# Patient Record
Sex: Male | Born: 1990 | Race: Black or African American | Hispanic: No | Marital: Single | Smoking: Former smoker
Health system: Southern US, Community
[De-identification: ages and names within clinical notes are randomized; demographics above are authoritative.]

## PROBLEM LIST (undated history)

## (undated) DIAGNOSIS — Z789 Other specified health status: Secondary | ICD-10-CM

## (undated) HISTORY — DX: Other specified health status: Z78.9

---
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
## 2014-04-10 ENCOUNTER — Telehealth: Payer: Self-pay

## 2014-04-10 NOTE — Telephone Encounter (Signed)
Error

## 2015-01-24 ENCOUNTER — Encounter (HOSPITAL_COMMUNITY): Payer: Self-pay

## 2015-01-24 ENCOUNTER — Emergency Department (HOSPITAL_COMMUNITY): Payer: Self-pay

## 2015-01-24 ENCOUNTER — Emergency Department (HOSPITAL_COMMUNITY)
Admission: EM | Admit: 2015-01-24 | Discharge: 2015-01-24 | Disposition: A | Discharge status: Home / Self Care | Payer: Self-pay | Attending: Emergency Medicine | Admitting: Emergency Medicine

## 2015-01-24 DIAGNOSIS — Y998 Other external cause status: Secondary | ICD-10-CM | POA: Insufficient documentation

## 2015-01-24 DIAGNOSIS — Z720 Tobacco use: Secondary | ICD-10-CM | POA: Insufficient documentation

## 2015-01-24 DIAGNOSIS — W14XXXA Fall from tree, initial encounter: Secondary | ICD-10-CM | POA: Insufficient documentation

## 2015-01-24 DIAGNOSIS — Y9389 Activity, other specified: Secondary | ICD-10-CM | POA: Insufficient documentation

## 2015-01-24 DIAGNOSIS — S42432A Displaced fracture (avulsion) of lateral epicondyle of left humerus, initial encounter for closed fracture: Secondary | ICD-10-CM | POA: Insufficient documentation

## 2015-01-24 DIAGNOSIS — Z791 Long term (current) use of non-steroidal anti-inflammatories (NSAID): Secondary | ICD-10-CM | POA: Insufficient documentation

## 2015-01-24 DIAGNOSIS — Y9289 Other specified places as the place of occurrence of the external cause: Secondary | ICD-10-CM | POA: Insufficient documentation

## 2015-01-24 DIAGNOSIS — S42433A Displaced fracture (avulsion) of lateral epicondyle of unspecified humerus, initial encounter for closed fracture: Secondary | ICD-10-CM

## 2015-01-24 MED ORDER — HYDROCODONE-ACETAMINOPHEN 5-325 MG PO TABS: 1.0000 | ORAL_TABLET | ORAL | Status: DC | PRN

## 2015-01-24 MED ORDER — ACETAMINOPHEN 325 MG PO TABS
650.0000 mg | ORAL_TABLET | Freq: Once | ORAL | Status: AC
  Administered 2015-01-24: 650 mg via ORAL
  Filled 2015-01-24: qty 2

## 2015-01-24 MED ORDER — NAPROXEN 500 MG PO TABS: 500.0000 mg | ORAL_TABLET | Freq: Two times a day (BID) | ORAL | Status: DC

## 2015-01-24 NOTE — Discharge Instructions (Signed)
Avulsion fracture of your lateral epicondyle.  A fracture is a break in one of the bones.When fractures are not displaced or separated, they may be treated with only a sling or splint. The sling or splint may only be required for two to three weeks. In these cases, often the elbow is put through early range of motion exercises to prevent the elbow from getting stiff. DIAGNOSIS  The diagnosis (learning what is wrong) of a fractured elbow is made by x-ray. These may be required before and after the elbow is put into a splint or cast. X-rays are taken after to make sure the bone pieces have not moved. HOME CARE INSTRUCTIONS   Only take over-the-counter or prescription medicines for pain, discomfort, or fever as directed by your caregiver.  If you have a splint held on with an elastic wrap and your hand or fingers become numb or cold and blue, loosen the wrap and reapply more loosely. See your caregiver if there is no relief.  You may use ice for twenty minutes, four times per day, for the first two to three days.  Use your elbow as directed.  See your caregiver as directed. It is very important to keep all follow-up referrals and appointments in order to avoid any long-term problems with your elbow including chronic pain or stiffness. SEEK IMMEDIATE MEDICAL CARE IF:   There is swelling or increasing pain in elbow.  You begin to lose feeling or experience numbness or tingling in your hand or fingers.  You develop swelling of the hand and fingers.  You get a cold or blue hand or fingers on affected side. MAKE SURE YOU:   Understand these instructions.  Will watch your condition.  Will get help right away if you are not doing well or get worse. Document Released: 10/26/2001 Document Revised: 01/24/2012 Document Reviewed: 09/16/2009 [facility] Patient Information 2015 [place], [place]. This information is not intended to replace advice given to you by your health care provider. Make sure you  discuss any questions you have with your health care provider. Arm Sling Use A sling is used to:  Limit how much your arm moves.  Make you more comfortable.  Support your arm. The sling fits well if:  Your elbow rests in the bottom and corner pocket.  Only your fingers show at the opening. Your wrist should fit inside and be supported by the sling.  The strap goes around your shoulder or neck for support.  Your arm is fairly level with your hand, slightly higher than your elbow. HOME CARE   Adjust the sling to keep the hand inside. Slings tend to slip, making the elbow point up. Tug the elbow back into place.  The fingers should feel warm and be a normal color.  Try to keep the palm of the hand toward the body while wearing the sling.  Take the sling off when going to sleep if this is okay with your doctor.  Use an extra pillow at night to protect the arm. Slide the arm between a pillow and the cover.  Take baths or showers as told by your doctor.  Only take medicine as told by your doctor. GET HELP RIGHT AWAY IF:   The fingers turn cold or start to tingle.  The arm pain gets worse.  The pain is not helped by medicine or by adjusting the sling. MAKE SURE YOU:   Understand these instructions.  Will watch this condition.  Will get help right away if  you are not doing well or get worse. Document Released: 04/19/2008 Document Revised: 01/24/2012 Document Reviewed: 04/19/2008 [facility] Patient Information 2015 [place], [place]. This information is not intended to replace advice given to you by your health care provider. Make sure you discuss any questions you have with your health care provider.

## 2015-01-24 NOTE — ED Notes (Signed)
Pt reports falling from a tree last night and has had left elbow pain since.  Swelling to extremity in triage.  Sensation intact distally to injury.

## 2015-01-24 NOTE — ED Provider Notes (Signed)
CSN: [id number]     Arrival date & time 01/24/15  1630 History  This chart was scribed for non-physician practitioner, Will Marijo File, PA-C working with Nelva Nay, MD by Angelene Giovanni, ED Scribe. The patient was seen in room TR10C/TR10C and the patient's care was started at 7:43 PM     Chief Complaint  Patient presents with  . Elbow Injury   The history is provided by the patient. No language interpreter was used.   HPI Comments: Chananya Bynum is a 24 y.o. male who presents to the Emergency Department status post fall that occurred last night when he fell from a 6 ft tree, landing on his elbow. He denies LOC, hitting his head or head injury. He reports a constant 6-8/10 left elbow pain worse with movement. He denies any other pain or injuries due to that fall. He denies any numbness, tingling, or weakness. He also denies taking any medication since onset.    History reviewed. No pertinent past medical history. History reviewed. No pertinent past surgical history. History reviewed. No pertinent family history. History  Substance Use Topics  . Smoking status: Current Some Day Smoker    Types: Cigarettes  . Smokeless tobacco: Not on file  . Alcohol Use: No    Review of Systems  Cardiovascular: Negative for chest pain.  Gastrointestinal: Negative for abdominal pain.  Musculoskeletal: Positive for arthralgias (Left elbow). Negative for back pain.  Neurological: Negative for weakness and numbness.      Allergies  Review of patient's allergies indicates not on file.  Home Medications   Prior to Admission medications   Medication Sig Start Date End Date Taking? Authorizing Provider  HYDROcodone-acetaminophen (NORCO/VICODIN) 5-325 MG per tablet Take 1 tablet by mouth every 4 (four) hours as needed. 01/24/15   Everlene Farrier, PA-C  naproxen (NAPROSYN) 500 MG tablet Take 1 tablet (500 mg total) by mouth 2 (two) times daily with a meal. 01/24/15   Everlene Farrier, PA-C   BP 140/80  mmHg  Pulse 90  Temp(Src) 98 F (36.7 C) (Oral)  Resp 14  Ht  (1.803 m)  Wt 178 lb (80.74 kg)  BMI 24.84 kg/m2  SpO2 94% Physical Exam  Constitutional: He is oriented to person, place, and time. He appears well-developed and well-nourished. No distress.  HENT:  Head: Normocephalic and atraumatic.  Eyes: Conjunctivae are normal. Pupils are equal, round, and reactive to light. Right eye exhibits no discharge. Left eye exhibits no discharge.  Neck: Neck supple. No tracheal deviation present.  Cardiovascular: Normal rate, regular rhythm, normal heart sounds and intact distal pulses.  Exam reveals no gallop and no friction rub.   No murmur heard. Bilateral radial pulses intact.    Pulmonary/Chest: Effort normal and breath sounds normal. No respiratory distress. He has no wheezes. He has no rales.  Musculoskeletal: He exhibits edema.  Mild swelling to left elbow and forearm Restrict ROM due to pain Sensation intact in bilateral upper extremities.   Neurological: He is alert and oriented to person, place, and time. Coordination normal.  Radial, median and ulna nerves intact on left hand.   Skin: Skin is warm and dry. No rash noted. No erythema. No pallor.  Psychiatric: He has a normal mood and affect. His behavior is normal.  Nursing note and vitals reviewed.   ED Course  Procedures (including critical care time) DIAGNOSTIC STUDIES: Oxygen Saturation is 94% on RA, adequate by my interpretation.    COORDINATION OF CARE: 7:47 PM- Pt advised of plan  for treatment and pt agrees.    Labs Review Labs Reviewed - No data to display  Imaging Review Dg Elbow Complete Left  01/24/2015   CLINICAL DATA:  Left elbow pain and swelling after falling from a tree yesterday. Initial encounter.  EXAM: LEFT ELBOW - COMPLETE 3+ VIEW  COMPARISON:  None.  FINDINGS: There is a small elbow joint effusion. There is a small avulsion fracture laterally, likely arising from the lateral humeral  epicondyle. The radial head appears intact and is normally positioned. The proximal ulna appears intact.  IMPRESSION: Small avulsion fracture laterally, likely from the lateral humeral epicondyle. Associated small elbow joint effusion. No dislocation.   Electronically Signed   By: Carey BullocksWilliam  Veazey M.D.   On: 01/24/2015 17:18     EKG Interpretation None      Filed Vitals:   01/24/15 1641  BP: 140/80  Pulse: 90  Temp: 98 F (36.7 C)  TempSrc: Oral  Resp: 14  Height: 5\' 11"  (1.803 m)  Weight: 178 lb (80.74 kg)  SpO2: 94%     MDM   Meds given in ED:  Medications  acetaminophen (TYLENOL) tablet 650 mg (650 mg Oral Given 01/24/15 1952)    New Prescriptions   HYDROCODONE-ACETAMINOPHEN (NORCO/VICODIN) 5-325 MG PER TABLET    Take 1 tablet by mouth every 4 (four) hours as needed.   NAPROXEN (NAPROSYN) 500 MG TABLET    Take 1 tablet (500 mg total) by mouth 2 (two) times daily with a meal.    Final diagnoses:  Avulsion fracture of lateral epicondyle of humerus   Visit 24 year old male who presents the emergency department complaining of left elbow pain after falling out of a tree yesterday. He denies head injury, neck, back pain, or loss of consciousness. Patient denies any numbness, tingling or weakness. The patient's radial, medial and ulnar nerves are intact in his left arm. The patient's x-ray indicates a small avulsion fracture laterally likely from the lateral humeral epicondyle. We'll discharge with arm sling and pain medicine. Advised patient to follow-up with orthopedic surgery. I advised the patient to follow-up with their primary care provider this week. I advised the patient to return to the emergency department with new or worsening symptoms or new concerns. The patient verbalized understanding and agreement with plan.   I personally performed the services described in this documentation, which was scribed in my presence. The recorded information has been reviewed and is  accurate.    Everlene FarrierWilliam Trinidee Schrag, PA-C 01/24/15 2000  Nelva Nayobert Beaton, MD 01/25/15 1056

## 2016-05-05 IMAGING — CR DG ELBOW COMPLETE 3+V*L*
4 series · 4 of 4 positions shown · non-contrast
Comparison: None.

CLINICAL DATA: Left elbow pain and swelling after falling from a
tree yesterday. Initial encounter.

EXAM:
LEFT ELBOW - COMPLETE 3+ VIEW

[elbow ap]
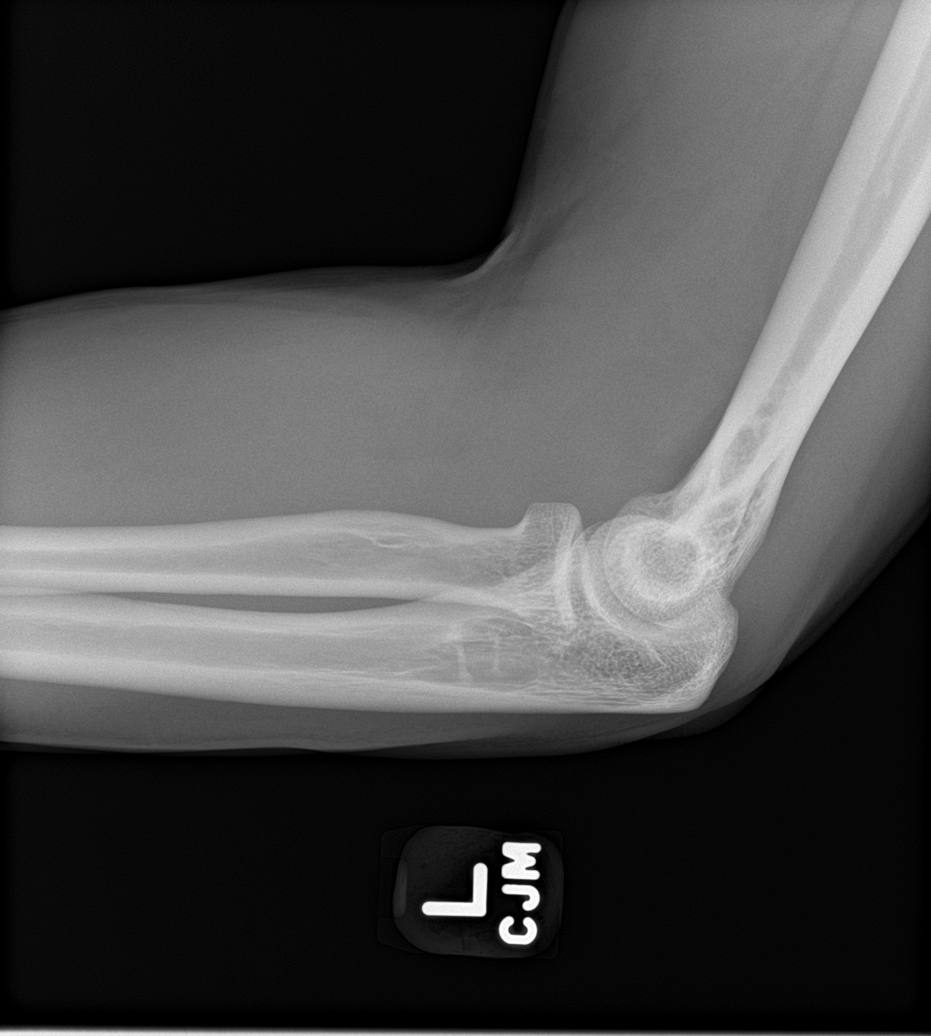

[elbow obl (1 of 2)]
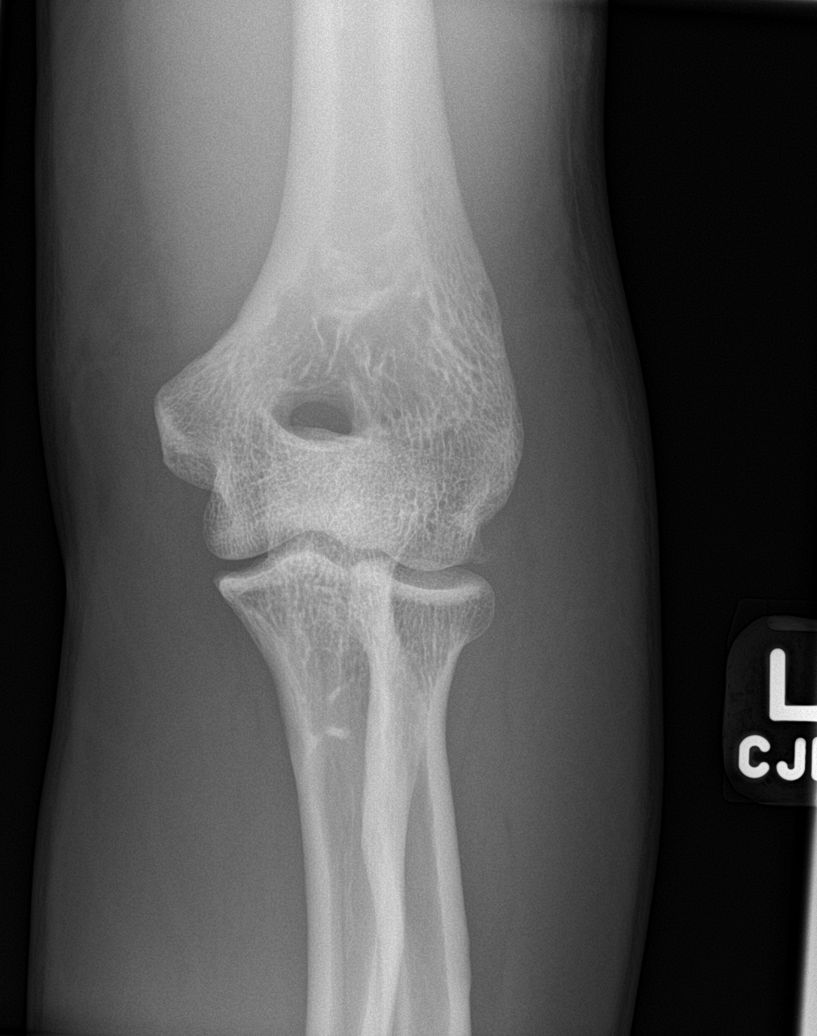

[elbow obl (2 of 2)]
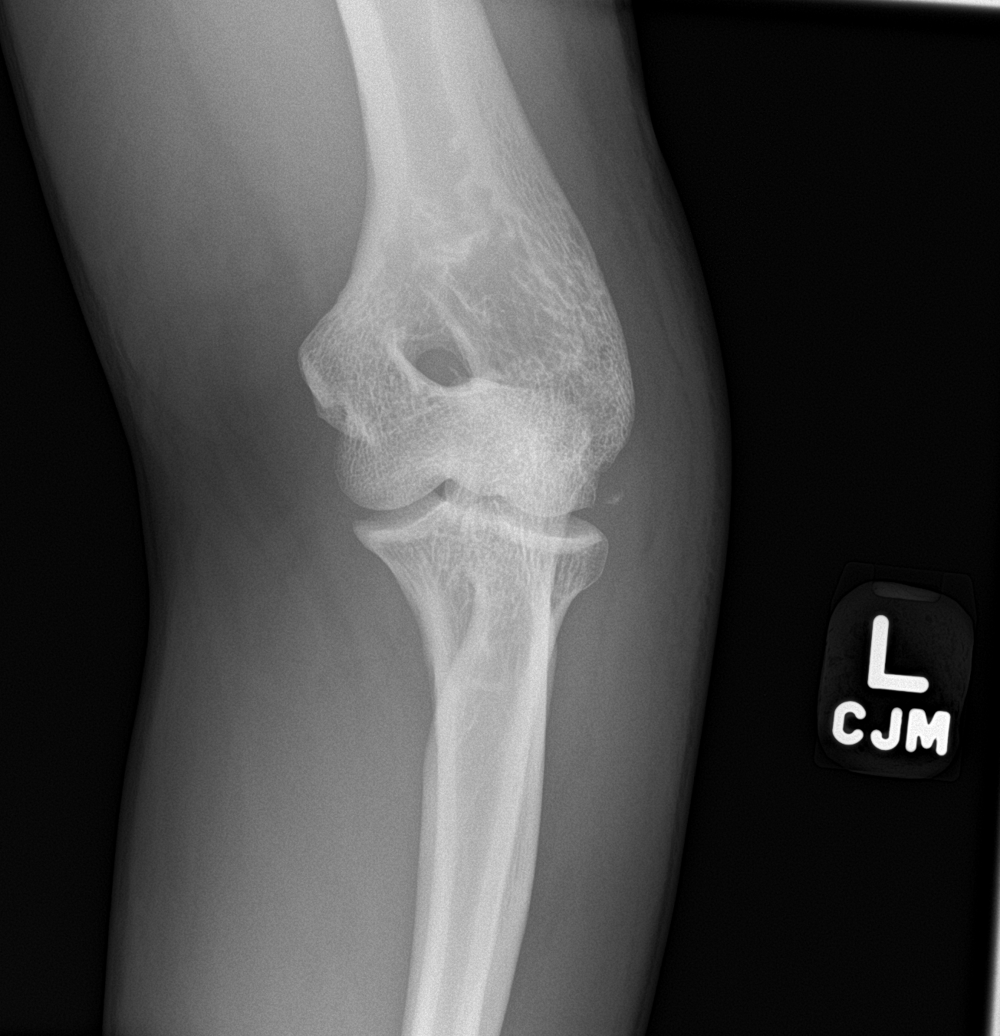

[elbow lat]
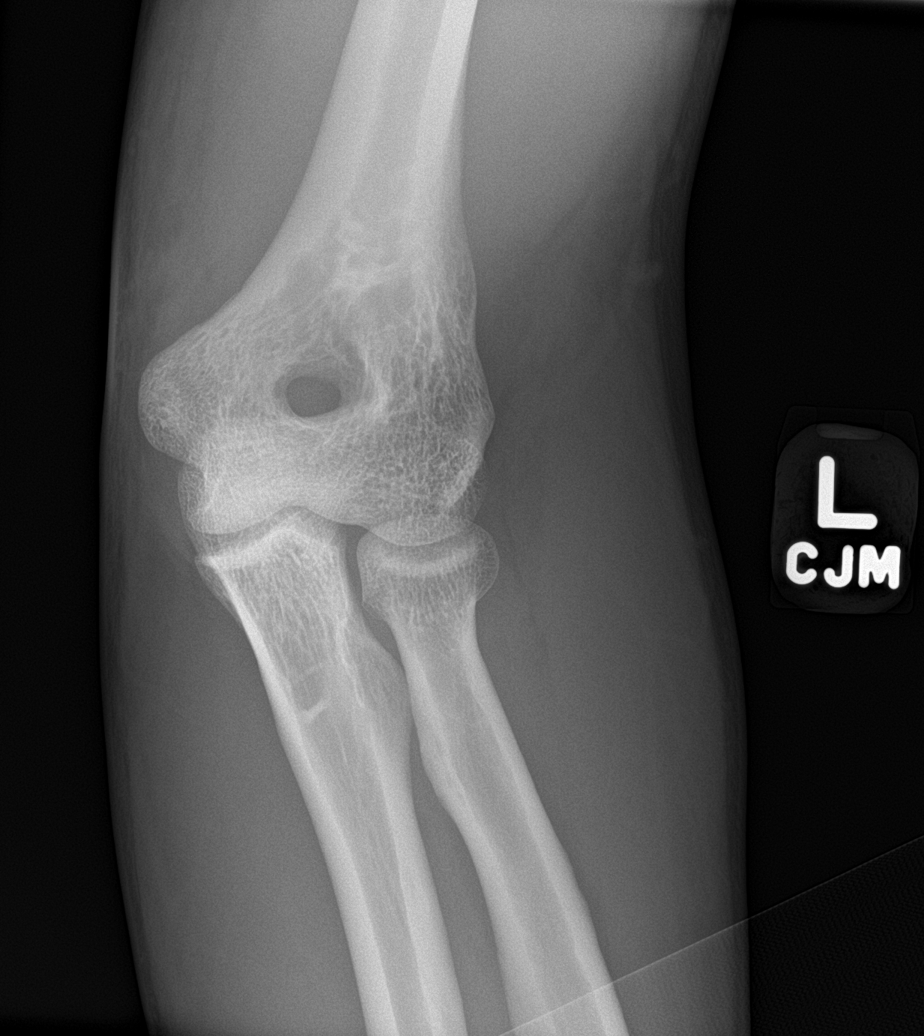

[4 of 4 positions shown; findings below may reference images not displayed]

FINDINGS: There is a small elbow joint effusion. There is a small avulsion
fracture laterally, likely arising from the lateral humeral
epicondyle. The radial head appears intact and is normally
positioned. The proximal ulna appears intact.
IMPRESSION: Small avulsion fracture laterally, likely from the lateral humeral
epicondyle. Associated small elbow joint effusion. No dislocation.

## 2018-12-19 ENCOUNTER — Ambulatory Visit (INDEPENDENT_AMBULATORY_CARE_PROVIDER_SITE_OTHER): Payer: BLUE CROSS/BLUE SHIELD | Admitting: Internal Medicine

## 2018-12-19 ENCOUNTER — Encounter: Payer: Self-pay | Admitting: Internal Medicine

## 2018-12-19 VITALS — BP 120/88 | HR 60 | Temp 98.4°F | Ht 72.0 in | Wt 196.6 lb

## 2018-12-19 DIAGNOSIS — Z716 Tobacco abuse counseling: Secondary | ICD-10-CM | POA: Diagnosis not present

## 2018-12-19 DIAGNOSIS — Z0000 Encounter for general adult medical examination without abnormal findings: Secondary | ICD-10-CM | POA: Diagnosis not present

## 2018-12-19 DIAGNOSIS — Z720 Tobacco use: Secondary | ICD-10-CM | POA: Diagnosis not present

## 2018-12-19 DIAGNOSIS — Z789 Other specified health status: Secondary | ICD-10-CM

## 2018-12-19 HISTORY — DX: Other specified health status: Z78.9

## 2018-12-19 NOTE — Patient Instructions (Addendum)
Great meeting you today.    Instructions: -schedule an annual physical next year, come fasting, so we can obtain blood work if needed   Preventive Care 18-39 Years, Male Preventive care refers to lifestyle choices and visits with your health care provider that can promote health and wellness. What does preventive care include?   A yearly physical exam. This is also called an annual well check.  Dental exams once or twice a year.  Routine eye exams. Ask your health care provider how often you should have your eyes checked.  Personal lifestyle choices, including: ? Daily care of your teeth and gums. ? Regular physical activity. ? Eating a healthy diet. ? Avoiding tobacco and drug use. ? Limiting alcohol use. ? Practicing safe sex. What happens during an annual well check? The services and screenings done by your health care provider during your annual well check will depend on your age, overall health, lifestyle risk factors, and family history of disease. Counseling Your health care provider may ask you questions about your:  Alcohol use.  Tobacco use.  Drug use.  Emotional well-being.  Home and relationship well-being.  Sexual activity.  Eating habits.  Work and work Statistician. Screening You may have the following tests or measurements:  Height, weight, and BMI.  Blood pressure.  Lipid and cholesterol levels. These may be checked every 5 years starting at age 48.  Diabetes screening. This is done by checking your blood sugar (glucose) after you have not eaten for a while (fasting).  Skin check.  Hepatitis C blood test.  Hepatitis B blood test.  Sexually transmitted disease (STD) testing. Discuss your test results, treatment options, and if necessary, the need for more tests with your health care provider. Vaccines Your health care provider may recommend certain vaccines, such as:  Influenza vaccine. This is recommended every year.  Tetanus,  diphtheria, and acellular pertussis (Tdap, Td) vaccine. You may need a Td booster every 10 years.  Varicella vaccine. You may need this if you have not been vaccinated.  HPV vaccine. If you are 30 or younger, you may need three doses over 6 months.  Measles, mumps, and rubella (MMR) vaccine. You may need at least one dose of MMR.You may also need a second dose.  Pneumococcal 13-valent conjugate (PCV13) vaccine. You may need this if you have certain conditions and have not been vaccinated.  Pneumococcal polysaccharide (PPSV23) vaccine. You may need one or two doses if you smoke cigarettes or if you have certain conditions.  Meningococcal vaccine. One dose is recommended if you are age 30-30 years and a first-year college student living in a residence hall, or if you have one of several medical conditions. You may also need additional booster doses.  Hepatitis A vaccine. You may need this if you have certain conditions or if you travel or work in places where you may be exposed to hepatitis A.  Hepatitis B vaccine. You may need this if you have certain conditions or if you travel or work in places where you may be exposed to hepatitis B.  Haemophilus influenzae type b (Hib) vaccine. You may need this if you have certain risk factors. Talk to your health care provider about which screenings and vaccines you need and how often you need them. This information is not intended to replace advice given to you by your health care provider. Make sure you discuss any questions you have with your health care provider. Document Released: 12/28/2001 Document Revised: 06/14/2017 Document Reviewed:  09/02/2015 Elsevier Interactive Patient Education  [employer].

## 2018-12-19 NOTE — Progress Notes (Signed)
New Patient Office Visit     CC/Reason for Visit: est. Care, PE Previous PCP: unknown, no care x5 years Last Visit: first visit  HPI: Carl Medina is a 28 y.o. male who is coming in today for the above mentioned reasons. Past Medical History is significant for prior smoking x3 years and now has been vaping nicotene x1 year.  He drinks about 6 beers on the weekends.  The only family history he knows of it PGM has hypertension.  He works in Engineer, technical sales and is in a relationship with a male.  They are sexually active and use condoms for protection.  He takes no medications and has no complaints today and plans to return next year for his annual physical.   Past Medical/Surgical History: Past Medical History:  Diagnosis Date  . Known health problems: none 12/19/2018    No past surgical history on file.  Social History:  reports that he quit smoking about 2 years ago. His smoking use included cigarettes. He quit after 3.00 years of use. He has never used smokeless tobacco. He reports current alcohol use of about 6.0 standard drinks of alcohol per week. He reports that he does not use drugs.  Allergies: No Known Allergies  Family History:  Family History  Problem Relation Age of Onset  . Hypertension Paternal Grandmother     No current outpatient medications on file.  Review of Systems:  Constitutional: Denies fever, chills, diaphoresis, appetite change and fatigue.  HEENT: Denies photophobia, eye pain, redness, hearing loss, ear pain, congestion, sore throat, rhinorrhea, sneezing, mouth sores, trouble swallowing, neck pain, neck stiffness and tinnitus.   Respiratory: Denies SOB, DOE, cough, chest tightness,  and wheezing.   Cardiovascular: Denies chest pain, palpitations and leg swelling.  Gastrointestinal: Denies nausea, vomiting, abdominal pain, diarrhea, constipation, blood in stool and abdominal distention.  Genitourinary: Denies dysuria, urgency, frequency, hematuria, flank  pain and difficulty urinating.  Endocrine: Denies: hot or cold intolerance, sweats, changes in hair or nails, polyuria, polydipsia. Musculoskeletal: Denies myalgias, back pain, joint swelling, arthralgias and gait problem.  Skin: Denies pallor, rash and wound.  Neurological: Denies dizziness, seizures, syncope, weakness, light-headedness, numbness and headaches.  Hematological: Denies adenopathy. Easy bruising, personal or family bleeding history  Psychiatric/Behavioral: Denies suicidal ideation, mood changes, confusion, nervousness, sleep disturbance and agitation   Physical Exam: Vitals:   12/19/18 0804  BP: 120/88  Pulse: 60  Temp: 98.4 F (36.9 C)  TempSrc: Oral  SpO2: 97%  Weight: 196 lb 9.6 oz (89.2 kg)  Height: 6' (1.829 m)   Body mass index is 26.66 kg/m.   Constitutional: NAD, calm, comfortable Eyes: PERRL, lids and conjunctivae normal Respiratory: clear to auscultation bilaterally, no wheezing, no crackles. Normal respiratory effort. No accessory muscle use.  Cardiovascular: Regular rate and rhythm, no murmurs / rubs / gallops. No extremity edema. 2+ pedal pulses. No carotid bruits.  Abdomen: no tenderness, no masses palpated. No hepatosplenomegaly. Bowel sounds positive.  Musculoskeletal: no clubbing / cyanosis. No joint deformity upper and lower extremities. Good ROM, no contractures. Normal muscle tone.  Skin: no rashes, lesions, ulcers. No induration Neurologic: CN 2-12 grossly intact. Sensation intact, DTR normal. Strength 5/5 in all 4.  Psychiatric: Normal judgment and insight. Alert and oriented x 3. Normal mood.    Impression and Plan:  Encounter for preventive health examination -Refused flu vaccine, UTD on tetanus. -Recommended routine eye and dental care. -Screening lab work starting at age 46 given no family history.  Tobacco abuse -  I have discussed tobacco cessation with the patient.  I have counseled the patient regarding the negative impacts of  continued tobacco use including but not limited to lung cancer, COPD, and cardiovascular disease.  I have discussed alternatives to tobacco and modalities that may help facilitate tobacco cessation including but not limited to biofeedback, hypnosis, and medications.  Total time spent with tobacco counseling was 4 minutes.       Patient Instructions  Great meeting you today.    Instructions: -schedule an annual physical next year, come fasting, so we can obtain blood work if needed   Preventive Care 18-39 Years, Male Preventive care refers to lifestyle choices and visits with your health care provider that can promote health and wellness. What does preventive care include?   A yearly physical exam. This is also called an annual well check.  Dental exams once or twice a year.  Routine eye exams. Ask your health care provider how often you should have your eyes checked.  Personal lifestyle choices, including: ? Daily care of your teeth and gums. ? Regular physical activity. ? Eating a healthy diet. ? Avoiding tobacco and drug use. ? Limiting alcohol use. ? Practicing safe sex. What happens during an annual well check? The services and screenings done by your health care provider during your annual well check will depend on your age, overall health, lifestyle risk factors, and family history of disease. Counseling Your health care provider may ask you questions about your:  Alcohol use.  Tobacco use.  Drug use.  Emotional well-being.  Home and relationship well-being.  Sexual activity.  Eating habits.  Work and work Statistician. Screening You may have the following tests or measurements:  Height, weight, and BMI.  Blood pressure.  Lipid and cholesterol levels. These may be checked every 5 years starting at age 73.  Diabetes screening. This is done by checking your blood sugar (glucose) after you have not eaten for a while (fasting).  Skin check.  Hepatitis  C blood test.  Hepatitis B blood test.  Sexually transmitted disease (STD) testing. Discuss your test results, treatment options, and if necessary, the need for more tests with your health care provider. Vaccines Your health care provider may recommend certain vaccines, such as:  Influenza vaccine. This is recommended every year.  Tetanus, diphtheria, and acellular pertussis (Tdap, Td) vaccine. You may need a Td booster every 10 years.  Varicella vaccine. You may need this if you have not been vaccinated.  HPV vaccine. If you are 27 or younger, you may need three doses over 6 months.  Measles, mumps, and rubella (MMR) vaccine. You may need at least one dose of MMR.You may also need a second dose.  Pneumococcal 13-valent conjugate (PCV13) vaccine. You may need this if you have certain conditions and have not been vaccinated.  Pneumococcal polysaccharide (PPSV23) vaccine. You may need one or two doses if you smoke cigarettes or if you have certain conditions.  Meningococcal vaccine. One dose is recommended if you are age 49-21 years and a first-year college student living in a residence hall, or if you have one of several medical conditions. You may also need additional booster doses.  Hepatitis A vaccine. You may need this if you have certain conditions or if you travel or work in places where you may be exposed to hepatitis A.  Hepatitis B vaccine. You may need this if you have certain conditions or if you travel or work in places where you may be  exposed to hepatitis B.  Haemophilus influenzae type b (Hib) vaccine. You may need this if you have certain risk factors. Talk to your health care provider about which screenings and vaccines you need and how often you need them. This information is not intended to replace advice given to you by your health care provider. Make sure you discuss any questions you have with your health care provider. Document Released: 12/28/2001 Document  Revised: 06/14/2017 Document Reviewed: 09/02/2015 Elsevier Interactive Patient Education  2019 [place], RN DNP student [facility] [employer]
